# Patient Record
Sex: Female | Born: 1970 | Race: White | Hispanic: No | Marital: Married | Smoking: Never smoker
Health system: Southern US, Community
[De-identification: ages and names within clinical notes are randomized; demographics above are authoritative.]

## PROBLEM LIST (undated history)

## (undated) DIAGNOSIS — J45909 Unspecified asthma, uncomplicated: Secondary | ICD-10-CM

## (undated) HISTORY — PX: URETER SURGERY: SHX823

## (undated) HISTORY — DX: Unspecified asthma, uncomplicated: J45.909

---

## 2017-07-13 ENCOUNTER — Other Ambulatory Visit: Payer: Self-pay | Admitting: Sports Medicine

## 2017-07-13 ENCOUNTER — Ambulatory Visit: Payer: Managed Care, Other (non HMO) | Admitting: Sports Medicine

## 2017-07-13 DIAGNOSIS — M2550 Pain in unspecified joint: Secondary | ICD-10-CM | POA: Diagnosis not present

## 2017-07-13 DIAGNOSIS — M7741 Metatarsalgia, right foot: Secondary | ICD-10-CM | POA: Diagnosis not present

## 2017-07-13 MED ORDER — MELOXICAM 15 MG PO TABS: ORAL_TABLET | ORAL | 3 refills | Status: DC

## 2017-07-13 NOTE — Assessment & Plan Note (Signed)
Metatarsal pad placed in shoe, return for custom molded orthotics with metatarsal pads.

## 2017-07-13 NOTE — Progress Notes (Signed)
   Subjective:    I'm seeing this patient as a consultation for:  Dr. Ferd GlassingAnn Potter  CC: Foot pain, arthralgias  HPI: Foot pain: Localized under the right foot at the head of the second through fourth metatarsals.  Worse with weightbearing.  No overt numbness and tingling into the toes.  She saw a chiropractor who told her it was her bunion.  Polyarthralgia: In the metacarpophalangeal joints and the proximal interphalangeal joints, stiffness in the morning, occasional pain with lateral loading of the joints.  No mechanical symptoms or clicking, no trauma.  No family history of rheumatoid arthritis.  Past medical history, Surgical history, Family history not pertinant except as noted below, Social history, Allergies, and medications have been entered into the medical record, reviewed, and no changes needed.   Review of Systems: No headache, visual changes, nausea, vomiting, diarrhea, constipation, dizziness, abdominal pain, skin rash, fevers, chills, night sweats, weight loss, swollen lymph nodes, body aches, joint swelling, muscle aches, chest pain, shortness of breath, mood changes, visual or auditory hallucinations.   Objective:   General: Well Developed, well nourished, and in no acute distress.  Neuro:  Extra-ocular muscles intact, able to move all 4 extremities, sensation grossly intact.  Deep tendon reflexes tested were normal. Psych: Alert and oriented, mood congruent with affect. ENT:  Ears and nose appear unremarkable.  Hearing grossly normal. Neck: Unremarkable overall appearance, trachea midline.  No visible thyroid enlargement. Eyes: Conjunctivae and lids appear unremarkable.  Pupils equal and round. Skin: Warm and dry, no rashes noted.  Cardiovascular: Pulses palpable, no extremity edema. Right foot: No visible erythema or swelling. Range of motion is full in all directions. Strength is 5/5 in all directions. Only minimal hallux valgus. No pes cavus or pes planus. No abnormal  callus noted. No pain over the navicular prominence, or base of fifth metatarsal. No tenderness to palpation of the calcaneal insertion of plantar fascia. No pain at the Achilles insertion. No pain over the calcaneal bursa. No pain of the retrocalcaneal bursa. No tenderness to palpation over the tarsals, metatarsals, or phalanges. No hallux rigidus or limitus. No tenderness palpation over interphalangeal joints. No pain with compression of the metatarsal heads. Neurovascularly intact distally. Foot inspection and palpation reveals breakdown of the transverse arch and a drop of MT heads. Abnormal callus is present between the second, third, and fourth metatarsal heads. Hammer toes are present. TTP under the metatarsal heads. Hands: Tenderness at the third proximal interphalangeal joints as well as the second and third metacarpophalangeal joints, no palpable synovitis.  No swelling.  Impression and Recommendations:   This case required medical decision making of moderate complexity.  Metatarsalgia, right foot Metatarsal pad placed in shoe, return for custom molded orthotics with metatarsal pads.  Polyarthralgia Mostly in the metacarpophalangeal joints and the proximal interphalangeal joints. Bilateral hand x-rays, rheumatoid testing. Adding meloxicam, we did discuss the pathophysiology and treatment protocol for osteoarthritis.  ___________________________________________ Ihor Austinhomas J. Benjamin Stainhekkekandam, M.D., ABFM., CAQSM. Primary Care and Sports Medicine [facility]  Adjunct Instructor of Family Medicine  [employer]

## 2017-07-13 NOTE — Assessment & Plan Note (Signed)
Mostly in the metacarpophalangeal joints and the proximal interphalangeal joints. Bilateral hand x-rays, rheumatoid testing. Adding meloxicam, we did discuss the pathophysiology and treatment protocol for osteoarthritis.

## 2017-07-17 ENCOUNTER — Ambulatory Visit: Payer: Managed Care, Other (non HMO) | Admitting: Sports Medicine

## 2017-07-17 DIAGNOSIS — M7741 Metatarsalgia, right foot: Secondary | ICD-10-CM

## 2017-07-17 NOTE — Progress Notes (Signed)
    Patient was fitted for a : standard, cushioned, semi-rigid orthotic. The orthotic was heated and afterward the patient stood on the orthotic blank positioned on the orthotic stand. The patient was positioned in subtalar neutral position and 10 degrees of ankle dorsiflexion in a weight bearing stance. After completion of molding, a stable base was applied to the orthotic blank. The blank was ground to a stable position for weight bearing. Size: 7 Base: [facility] Additional Posting and Padding: Medium metatarsal pad right The patient ambulated these, and they were very comfortable.  I spent 40 minutes with this patient, greater than 50% was face-to-face time counseling regarding the below diagnosis.  ___________________________________________ Ihor Austinhomas J. Benjamin Stainhekkekandam, M.D., ABFM., CAQSM. Primary Care and Sports Medicine [facility]  Adjunct Instructor of Family Medicine  [employer]

## 2017-07-17 NOTE — Assessment & Plan Note (Signed)
Custom orthotics with metatarsal pad, left plantar fasciitis as well. Rehab exercises given. Return in 1 month.

## 2017-07-18 LAB — COMPREHENSIVE METABOLIC PANEL WITH GFR
ALT: 10 U/L (ref 6–29)
AST: 17 U/L (ref 10–35)
Alkaline phosphatase (APISO): 54 U/L (ref 33–115)
BUN: 14 mg/dL (ref 7–25)
Calcium: 10 mg/dL (ref 8.6–10.2)
Creat: 1.09 mg/dL (ref 0.50–1.10)
Potassium: 4.2 mmol/L (ref 3.5–5.3)
Total Bilirubin: 0.6 mg/dL (ref 0.2–1.2)

## 2017-07-18 LAB — CBC WITH DIFFERENTIAL/PLATELET
Basophils Absolute: 31 cells/uL (ref 0–200)
Basophils Relative: 0.6 %
Eosinophils Absolute: 140 cells/uL (ref 15–500)
Eosinophils Relative: 2.7 %
HCT: 40.3 % (ref 35.0–45.0)
Hemoglobin: 13.5 g/dL (ref 11.7–15.5)
Lymphs Abs: 1451 cells/uL (ref 850–3900)
MCH: 29.5 pg (ref 27.0–33.0)
MCHC: 33.5 g/dL (ref 32.0–36.0)
MCV: 88.2 fL (ref 80.0–100.0)
MPV: 11 fL (ref 7.5–12.5)
Monocytes Relative: 6.3 %
Neutro Abs: 3250 cells/uL (ref 1500–7800)
Neutrophils Relative %: 62.5 %
Platelets: 272 Thousand/uL (ref 140–400)
RBC: 4.57 Million/uL (ref 3.80–5.10)
RDW: 12.5 % (ref 11.0–15.0)
Total Lymphocyte: 27.9 %
WBC mixed population: 328 {cells}/uL (ref 200–950)
WBC: 5.2 Thousand/uL (ref 3.8–10.8)

## 2017-07-18 LAB — COMPREHENSIVE METABOLIC PANEL
AG Ratio: 1.6 (calc) (ref 1.0–2.5)
Albumin: 4.3 g/dL (ref 3.6–5.1)
CO2: 27 mmol/L (ref 20–32)
Chloride: 104 mmol/L (ref 98–110)
Globulin: 2.7 g/dL (calc) (ref 1.9–3.7)
Glucose, Bld: 79 mg/dL (ref 65–139)
Sodium: 139 mmol/L (ref 135–146)
Total Protein: 7 g/dL (ref 6.1–8.1)

## 2017-07-18 LAB — RHEUMATOID ARTHRITIS DIAGNOSTIC PANEL, COMPREHENSIVE
Cyclic Citrullin Peptide Ab: <16 U (ref <20)
Rheumatoid Factor (IgA): <5 U (ref <=6)
Rheumatoid Factor (IgG): 9 U — ABNORMAL HIGH (ref <=6)
Rheumatoid Factor (IgM): 16 U — ABNORMAL HIGH (ref <=6)
SSA (Ro) (ENA) Antibody, IgG: <1 AI (ref <1.0)
SSB (La) (ENA) Antibody, IgG: <1 AI (ref <1.0)

## 2017-07-18 LAB — CK: Total CK: 89 U/L (ref 29–143)

## 2017-07-18 LAB — URIC ACID: Uric Acid, Serum: 3.7 mg/dL (ref 2.5–7.0)

## 2017-07-18 LAB — SEDIMENTATION RATE: Sed Rate: 6 mm/h (ref 0–20)

## 2017-08-14 ENCOUNTER — Encounter: Payer: Self-pay | Admitting: Sports Medicine

## 2017-08-14 ENCOUNTER — Ambulatory Visit: Payer: Managed Care, Other (non HMO) | Admitting: Sports Medicine

## 2017-08-14 DIAGNOSIS — M7741 Metatarsalgia, right foot: Secondary | ICD-10-CM | POA: Diagnosis not present

## 2017-08-14 DIAGNOSIS — M2550 Pain in unspecified joint: Secondary | ICD-10-CM | POA: Diagnosis not present

## 2017-08-14 NOTE — Assessment & Plan Note (Signed)
Rheumatoid testing, meloxicam has helped her hand pain significantly. We did discuss some non-proven supplements that may be of help. I also gave her some Pennsaid samples.

## 2017-08-14 NOTE — Progress Notes (Signed)
  Subjective:    CC: Foot pain  HPI: Plantar fasciitis: Significantly better with custom orthotics, not diligent with the stretches and continues to walk around barefoot at the house.  Metatarsalgia: Right-sided, improved with custom orthotics with a metatarsal pad  Polyarthralgia: Improved with meloxicam, rheumatoid workup was negative.  Past medical history:  Negative.  See flowsheet/record as well for more information.  Surgical history: Negative.  See flowsheet/record as well for more information.  Family history: Negative.  See flowsheet/record as well for more information.  Social history: Negative.  See flowsheet/record as well for more information.  Allergies, and medications have been entered into the medical record, reviewed, and no changes needed.   (To billers/coders, pertinent past medical, social, surgical, family history can be found in problem list, if problem list is marked as reviewed then this indicates that past medical, social, surgical, family history was also reviewed)  Review of Systems: No fevers, chills, night sweats, weight loss, chest pain, or shortness of breath.   Objective:    General: Well Developed, well nourished, and in no acute distress.  Neuro: Alert and oriented x3, extra-ocular muscles intact, sensation grossly intact.  HEENT: Normocephalic, atraumatic, pupils equal round reactive to light, neck supple, no masses, no lymphadenopathy, thyroid nonpalpable.  Skin: Warm and dry, no rashes. Cardiac: Regular rate and rhythm, no murmurs rubs or gallops, no lower extremity edema.  Respiratory: Clear to auscultation bilaterally. Not using accessory muscles, speaking in full sentences.  Impression and Recommendations:    Polyarthralgia Rheumatoid testing, meloxicam has helped her hand pain significantly. We did discuss some non-proven supplements that may be of help. I also gave her some Pennsaid samples.  Metatarsalgia, right foot Plantar fasciitis  and metatarsalgia are significantly better. We discussed avoiding barefoot walking, and she really has been that diligent with her rehab exercises. She does have hallux valgus on the right side, she will wear her bunion separator, if this does not work I can do a first MTP injection. Return as needed.  I spent 25 minutes with this patient, greater than 50% was face-to-face time counseling regarding the above diagnoses ___________________________________________ Ihor Austinhomas J. Benjamin Stainhekkekandam, M.D., ABFM., CAQSM. Primary Care and Sports Medicine [facility]  Adjunct Instructor of Family Medicine  [employer]

## 2017-08-14 NOTE — Assessment & Plan Note (Signed)
Plantar fasciitis and metatarsalgia are significantly better. We discussed avoiding barefoot walking, and she really has been that diligent with her rehab exercises. She does have hallux valgus on the right side, she will wear her bunion separator, if this does not work I can do a first MTP injection. Return as needed.

## 2018-07-20 ENCOUNTER — Ambulatory Visit (INDEPENDENT_AMBULATORY_CARE_PROVIDER_SITE_OTHER): Payer: Managed Care, Other (non HMO)

## 2018-07-20 ENCOUNTER — Encounter: Payer: Self-pay | Admitting: Sports Medicine

## 2018-07-20 ENCOUNTER — Ambulatory Visit: Payer: Managed Care, Other (non HMO) | Admitting: Sports Medicine

## 2018-07-20 DIAGNOSIS — M25551 Pain in right hip: Secondary | ICD-10-CM

## 2018-07-20 DIAGNOSIS — G8929 Other chronic pain: Secondary | ICD-10-CM

## 2018-07-20 DIAGNOSIS — S73191A Other sprain of right hip, initial encounter: Secondary | ICD-10-CM | POA: Insufficient documentation

## 2018-07-20 MED ORDER — MELOXICAM 15 MG PO TABS
ORAL_TABLET | ORAL | 3 refills | Status: DC
Start: 2018-07-20 — End: 2022-04-29

## 2018-07-20 NOTE — Progress Notes (Signed)
Subjective:    I'm seeing this patient as a consultation for: Dr. Ardith DarkVictor Korang  CC: Right hip pain  HPI: This is a pleasant 47 year old female, for 1 year she is had pain that she localizes in her right hip, radiating around to the groin, worse with prolonged weightbearing, better with sitting, no gelling.  No mechanical symptoms or trauma.  Worse with resisted hip flexion.  She has seen a chiropractor and had some manipulations without any improvement.  Has not tried any NSAIDs and has not had any imaging.  I reviewed the past medical history, family history, social history, surgical history, and allergies today and no changes were needed.  Please see the problem list section below in epic for further details.  Past Medical History: Past Medical History:  Diagnosis Date  . Asthma    Past Surgical History: Past Surgical History:  Procedure Laterality Date  . URETER SURGERY     Social History: Social History   Socioeconomic History  . Marital status: Married    Spouse name: Not on file  . Number of children: Not on file  . Years of education: Not on file  . Highest education level: Not on file  Occupational History  . Not on file  Social Needs  . Financial resource strain: Not on file  . Food insecurity:    Worry: Not on file    Inability: Not on file  . Transportation needs:    Medical: Not on file    Non-medical: Not on file  Tobacco Use  . Smoking status: Never Smoker  . Smokeless tobacco: Never Used  Substance and Sexual Activity  . Alcohol use: No    Frequency: Never  . Drug use: No  . Sexual activity: Not on file  Lifestyle  . Physical activity:    Days per week: Not on file    Minutes per session: Not on file  . Stress: Not on file  Relationships  . Social connections:    Talks on phone: Not on file    Gets together: Not on file    Attends religious service: Not on file    Active member of club or organization: Not on file    Attends meetings of  clubs or organizations: Not on file    Relationship status: Not on file  Other Topics Concern  . Not on file  Social History Narrative  . Not on file   Family History: No family history on file. Allergies: Allergies  Allergen Reactions  . Amoxicillin-Pot Clavulanate Rash   Medications: See med rec.  Review of Systems: No headache, visual changes, nausea, vomiting, diarrhea, constipation, dizziness, abdominal pain, skin rash, fevers, chills, night sweats, weight loss, swollen lymph nodes, body aches, joint swelling, muscle aches, chest pain, shortness of breath, mood changes, visual or auditory hallucinations.   Objective:   General: Well Developed, well nourished, and in no acute distress.  Neuro:  Extra-ocular muscles intact, able to move all 4 extremities, sensation grossly intact.  Deep tendon reflexes tested were normal. Psych: Alert and oriented, mood congruent with affect. ENT:  Ears and nose appear unremarkable.  Hearing grossly normal. Neck: Unremarkable overall appearance, trachea midline.  No visible thyroid enlargement. Eyes: Conjunctivae and lids appear unremarkable.  Pupils equal and round. Skin: Warm and dry, no rashes noted.  Cardiovascular: Pulses palpable, no extremity edema. Right hip: ROM IR: 60 Deg, ER: 60 Deg, Flexion: 120 Deg, Extension: 100 Deg, Abduction: 45 Deg, Adduction: 45 Deg Reproduction of  pain, severe with internal rotation, reproduction of pain with resisted hip flexion. Positive FADIR test. Strength IR: 5/5, ER: 5/5, Flexion: 5/5, Extension: 5/5, Abduction: 5/5, Adduction: 5/5 Pelvic alignment unremarkable to inspection and palpation. Standing hip rotation and gait without trendelenburg / unsteadiness. Greater trochanter without tenderness to palpation. No tenderness over piriformis. No SI joint tenderness and normal minimal SI movement.  Impression and Recommendations:   This case required medical decision making of moderate  complexity.  Right hip pain I have a differential here, hip flexor injury, labral tear, osteoarthritis. Adding meloxicam, hip x-rays, hip flexor rehabilitation exercises.   Return to see me in 2 to 4 weeks, if persistent discomfort we will proceed with an MR arthrogram, pain has been present for a year now.  ___________________________________________ Ihor Austin. Benjamin Stain, M.D., ABFM., CAQSM. Primary Care and Sports Medicine [facility]  Adjunct Professor of Family Medicine  [employer]

## 2018-07-20 NOTE — Assessment & Plan Note (Signed)
I have a differential here, hip flexor injury, labral tear, osteoarthritis. Adding meloxicam, hip x-rays, hip flexor rehabilitation exercises.   Return to see me in 2 to 4 weeks, if persistent discomfort we will proceed with an MR arthrogram, pain has been present for a year now.

## 2018-08-19 ENCOUNTER — Ambulatory Visit (INDEPENDENT_AMBULATORY_CARE_PROVIDER_SITE_OTHER): Payer: Managed Care, Other (non HMO) | Admitting: Sports Medicine

## 2018-08-19 ENCOUNTER — Ambulatory Visit (INDEPENDENT_AMBULATORY_CARE_PROVIDER_SITE_OTHER): Payer: Managed Care, Other (non HMO)

## 2018-08-19 ENCOUNTER — Encounter: Payer: Self-pay | Admitting: Sports Medicine

## 2018-08-19 DIAGNOSIS — M25511 Pain in right shoulder: Secondary | ICD-10-CM

## 2018-08-19 DIAGNOSIS — M25551 Pain in right hip: Secondary | ICD-10-CM | POA: Diagnosis not present

## 2018-08-19 NOTE — Assessment & Plan Note (Signed)
Persistent anterior pain, x-rays were for the most part negative. Concern continues to be for hip labral tear, hip flexor injury. No better after conservative treatment. Overall pain has been for 1 year. Proceeding with MR arthrogram of the right hip.

## 2018-08-19 NOTE — Assessment & Plan Note (Signed)
Exam is concerning for labral injury. Rotator cuff rehab exercises, x-rays done today. Return to see me in 4 to 6 weeks, MR arthrogram if no better.

## 2018-08-19 NOTE — Progress Notes (Signed)
Subjective:    I'm seeing this patient as a consultation for:  Dr. Ardith Dark  CC: Right shoulder pain  HPI: This is a pleasant 47 year old female, for the past few weeks she is noted increasing pain over her deltoid, worse with overhead activities, abduction of the shoulder.  No overt mechanical symptoms, trauma.  Hip pain: Not much better with conservative treatment, she does get mechanical symptoms, her exam was suspicious for a hip labral tear at the last visit.  I reviewed the past medical history, family history, social history, surgical history, and allergies today and no changes were needed.  Please see the problem list section below in epic for further details.  Past Medical History: Past Medical History:  Diagnosis Date  . Asthma    Past Surgical History: Past Surgical History:  Procedure Laterality Date  . URETER SURGERY     Social History: Social History   Socioeconomic History  . Marital status: Married    Spouse name: Not on file  . Number of children: Not on file  . Years of education: Not on file  . Highest education level: Not on file  Occupational History  . Not on file  Social Needs  . Financial resource strain: Not on file  . Food insecurity:    Worry: Not on file    Inability: Not on file  . Transportation needs:    Medical: Not on file    Non-medical: Not on file  Tobacco Use  . Smoking status: Never Smoker  . Smokeless tobacco: Never Used  Substance and Sexual Activity  . Alcohol use: No    Frequency: Never  . Drug use: No  . Sexual activity: Not on file  Lifestyle  . Physical activity:    Days per week: Not on file    Minutes per session: Not on file  . Stress: Not on file  Relationships  . Social connections:    Talks on phone: Not on file    Gets together: Not on file    Attends religious service: Not on file    Active member of club or organization: Not on file    Attends meetings of clubs or organizations: Not on file   Relationship status: Not on file  Other Topics Concern  . Not on file  Social History Narrative  . Not on file   Family History: No family history on file. Allergies: Allergies  Allergen Reactions  . Amoxicillin-Pot Clavulanate Rash   Medications: See med rec.  Review of Systems: No headache, visual changes, nausea, vomiting, diarrhea, constipation, dizziness, abdominal pain, skin rash, fevers, chills, night sweats, weight loss, swollen lymph nodes, body aches, joint swelling, muscle aches, chest pain, shortness of breath, mood changes, visual or auditory hallucinations.   Objective:   General: Well Developed, well nourished, and in no acute distress.  Neuro:  Extra-ocular muscles intact, able to move all 4 extremities, sensation grossly intact.  Deep tendon reflexes tested were normal. Psych: Alert and oriented, mood congruent with affect. ENT:  Ears and nose appear unremarkable.  Hearing grossly normal. Neck: Unremarkable overall appearance, trachea midline.  No visible thyroid enlargement. Eyes: Conjunctivae and lids appear unremarkable.  Pupils equal and round. Skin: Warm and dry, no rashes noted.  Cardiovascular: Pulses palpable, no extremity edema. Right shoulder: Inspection reveals no abnormalities, atrophy or asymmetry. Palpation is normal with no tenderness over AC joint or bicipital groove. ROM is full in all planes. Rotator cuff strength normal throughout. No signs of  impingement with negative Neer and Hawkin's tests, empty can. Speeds and Yergason's tests normal. Positive Obrien's, negative crank, negative clunk, and good stability. Normal scapular function observed. No painful arc and no drop arm sign. No apprehension sign  Impression and Recommendations:   This case required medical decision making of moderate complexity.  Right shoulder pain Exam is concerning for labral injury. Rotator cuff rehab exercises, x-rays done today. Return to see me in 4 to 6  weeks, MR arthrogram if no better.  Right hip pain Persistent anterior pain, x-rays were for the most part negative. Concern continues to be for hip labral tear, hip flexor injury. No better after conservative treatment. Overall pain has been for 1 year. Proceeding with MR arthrogram of the right hip. ___________________________________________ Ihor Austinhomas J. Benjamin Stainhekkekandam, M.D., ABFM., CAQSM. Primary Care and Sports Medicine [facility]  Adjunct Professor of Family Medicine  [employer]

## 2018-09-06 ENCOUNTER — Other Ambulatory Visit: Payer: Managed Care, Other (non HMO)

## 2018-09-06 ENCOUNTER — Ambulatory Visit: Payer: Managed Care, Other (non HMO) | Admitting: Sports Medicine

## 2018-09-20 ENCOUNTER — Ambulatory Visit: Payer: Managed Care, Other (non HMO) | Admitting: Sports Medicine

## 2018-09-20 ENCOUNTER — Other Ambulatory Visit: Payer: Managed Care, Other (non HMO)

## 2018-09-28 ENCOUNTER — Ambulatory Visit: Payer: Managed Care, Other (non HMO) | Admitting: Sports Medicine

## 2018-10-22 ENCOUNTER — Telehealth: Payer: Self-pay | Admitting: Sports Medicine

## 2018-10-22 DIAGNOSIS — M25551 Pain in right hip: Secondary | ICD-10-CM

## 2018-10-22 NOTE — Telephone Encounter (Signed)
I have already ordered it for the hip.  Let me know when she confirms whether she is referring to the hip or the shoulder.  Remind her also that we can do it here.

## 2018-10-22 NOTE — Telephone Encounter (Signed)
Pt called and left message needing orders for an arthrogram MRI, called and left message for pt to verify which are shoulder or hip, asked patient to call back and verify. When we get order we need to fax to [facility] at [phone]

## 2018-10-25 ENCOUNTER — Encounter: Payer: Self-pay | Admitting: *Deleted

## 2018-10-25 NOTE — Telephone Encounter (Signed)
This encounter was created in error - please disregard.

## 2018-10-25 NOTE — Telephone Encounter (Signed)
Ok order placed.  

## 2018-10-25 NOTE — Telephone Encounter (Signed)
Pt called back to let us know that the MRI is of her right hip.  I see the order in her chart but she needs it sent to [facility] and authorized for them.

## 2018-10-25 NOTE — Addendum Note (Signed)
Addended by: Monica Becton on: 10/25/2018 05:39 PM   Modules accepted: Orders

## 2018-10-27 NOTE — Telephone Encounter (Signed)
Received call from [facility], [phone]) advising they cannot do the MR Arthrogram.   Called and spoke with Pt, she will contact insurance company and see where else she can go. She will provide me with update.

## 2018-11-15 ENCOUNTER — Ambulatory Visit: Payer: Managed Care, Other (non HMO) | Admitting: Sports Medicine

## 2018-11-15 ENCOUNTER — Ambulatory Visit (INDEPENDENT_AMBULATORY_CARE_PROVIDER_SITE_OTHER): Payer: Managed Care, Other (non HMO)

## 2018-11-15 DIAGNOSIS — S73191S Other sprain of right hip, sequela: Secondary | ICD-10-CM

## 2018-11-15 DIAGNOSIS — S73191A Other sprain of right hip, initial encounter: Secondary | ICD-10-CM | POA: Diagnosis not present

## 2018-11-15 DIAGNOSIS — X58XXXA Exposure to other specified factors, initial encounter: Secondary | ICD-10-CM

## 2018-11-15 DIAGNOSIS — M25551 Pain in right hip: Secondary | ICD-10-CM

## 2018-11-15 MED ORDER — GADOBUTROL 1 MMOL/ML IV SOLN
1.0000 mL | Freq: Once | INTRAVENOUS | Status: AC | PRN
  Administered 2018-11-15: 1 mL via INTRAVENOUS

## 2018-11-15 NOTE — Progress Notes (Signed)
  Procedure: Real-time Ultrasound Guided gadolinium contrast injection of right femoral acetabular joint Device: GE Logiq E  Verbal informed consent obtained.  Time-out conducted.  Noted no overlying erythema, induration, or other signs of local infection.  Skin prepped in a sterile fashion.  Local anesthesia: Topical Ethyl chloride.  With sterile technique and under real time ultrasound guidance: 22-gauge spinal needle advanced to the femoral head/neck junction, contacted bone and injected 1 cc Kenalog 40, 2 cc lidocaine, 2 cc bupivacaine, syringe switched and I injected 0.1 cc gadolinium, syringe again switched and joint distended with 12 cc of sterile saline. Joint visualized and capsule seen distending confirming intra-articular placement of contrast material and medication. Completed without difficulty  Advised to call if fevers/chills, erythema, induration, drainage, or persistent bleeding.  Images permanently stored and available for review in the ultrasound unit.  Impression: Technically successful ultrasound guided gadolinium contrast injection for MR arthrography.  Please see separate MR arthrogram report.

## 2018-11-15 NOTE — Assessment & Plan Note (Addendum)
Arthrogram injection for hip MRI. Further management per results.  Degenerative tearing of the anterior, and superior hip labrum, this is expected, lets see how things go over the next month because sometimes the steroid injectioned can calm things down.

## 2019-12-07 IMAGING — MR MRI OF THE RIGHT HIP WITH CONTRAST
5 series · 40 of 40 positions shown · IV contrast (agent unspecified)
Comparison: Plain films right hip 07/20/2018.

CLINICAL DATA: Anterior and lateral right hip pain for 1.5 years.
No known injury.

EXAM:
MRI OF THE RIGHT HIP WITH CONTRAST (MR Arthrogram)
TECHNIQUE: Multiplanar, multisequence MR imaging of the hip was performed
immediately following contrast injection into the hip joint under
fluoroscopic guidance. No intravenous contrast was administered.

[Series 3: T1 fat-sat · axial · 4.0mm · 0.47mm/px · z∈[-130,-22]mm · 7 of 25 slices shown (1 of 3)]
[im 1/25]
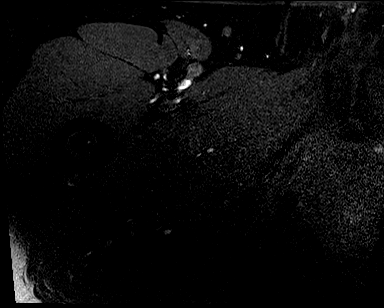
[im 5/25]
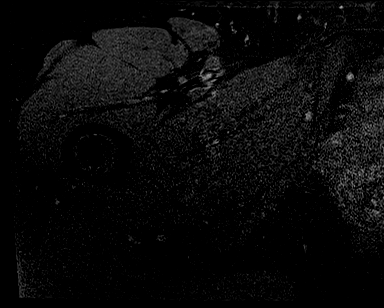
[im 9/25]
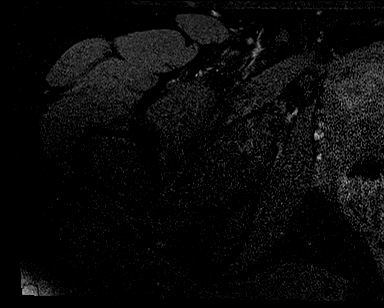
[im 13/25]
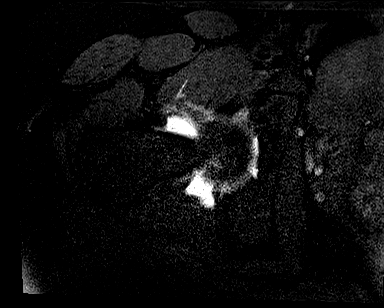
[im 17/25]
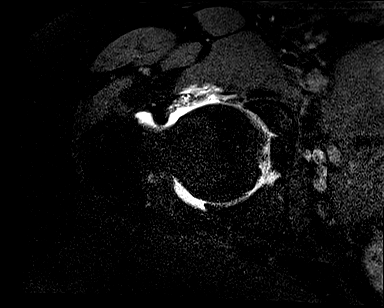
[im 21/25]
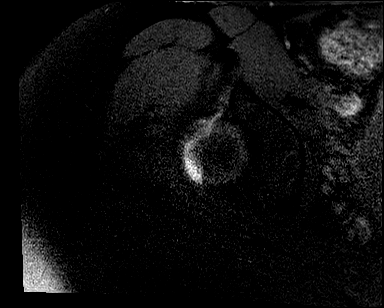
[im 25/25]
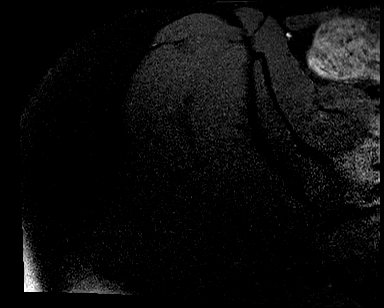

[Series 4: T1 fat-sat · coronal · 4.0mm · 0.56mm/px · 7 of 25 slices shown (2 of 3)]
[im 1/25]
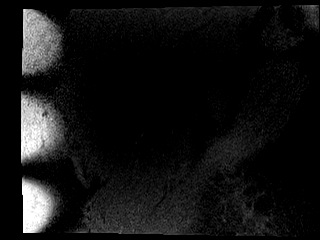
[im 5/25]
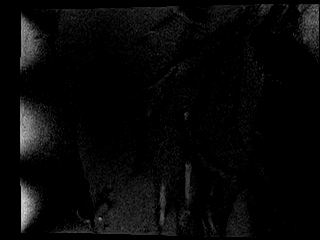
[im 9/25]
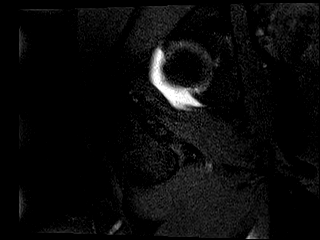
[im 13/25]
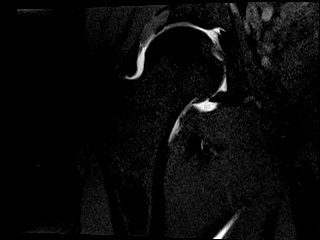
[im 17/25]
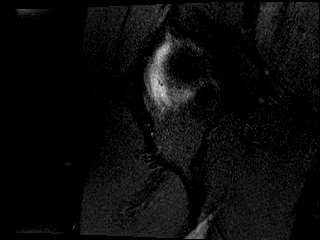
[im 21/25]
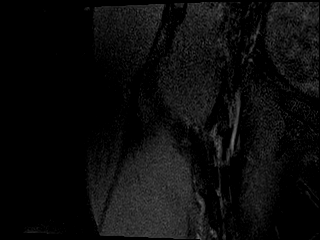
[im 25/25]
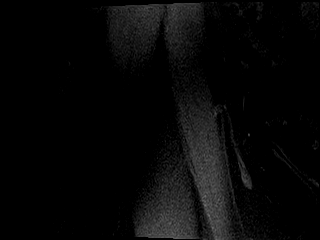

[Series 5: T1 fat-sat · sagittal · 4.0mm · 0.56mm/px · 8 of 28 slices shown (3 of 3)]
[im 1/28]
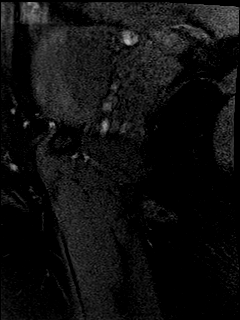
[im 4/28]
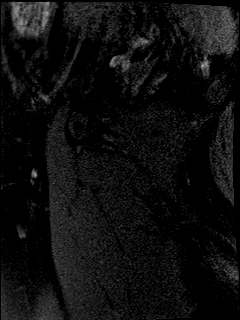
[im 8/28]
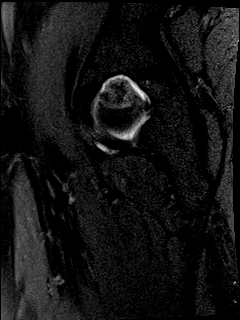
[im 12/28]
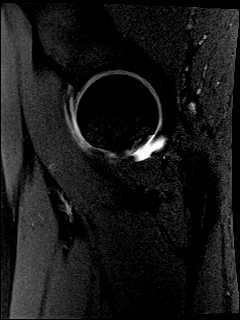
[im 16/28]
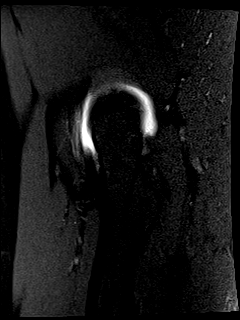
[im 20/28]
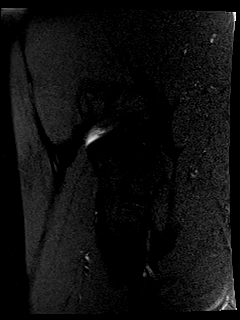
[im 24/28]
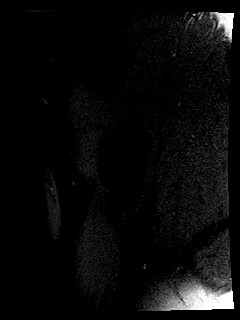
[im 28/28]
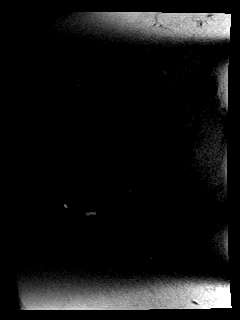

[Series 6: T1 · coronal · 4.0mm · 0.85mm/px · 9 of 35 slices shown]
[im 1/35]
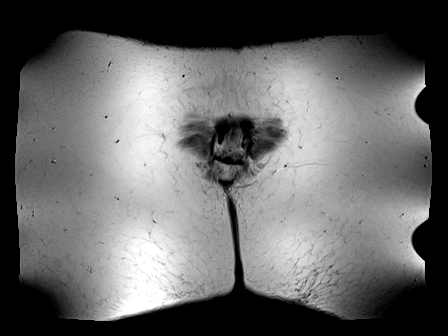
[im 5/35]
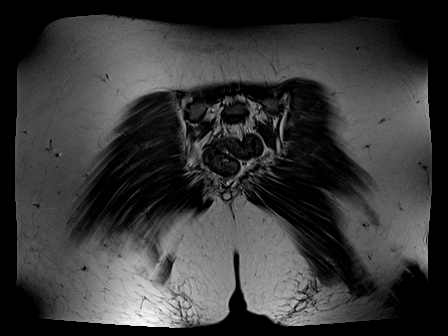
[im 9/35]
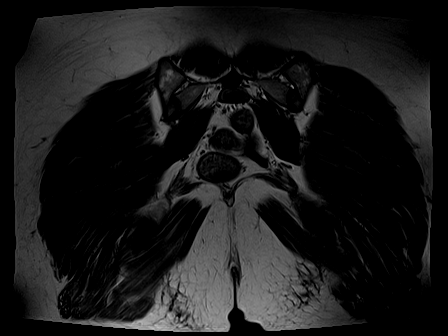
[im 13/35]
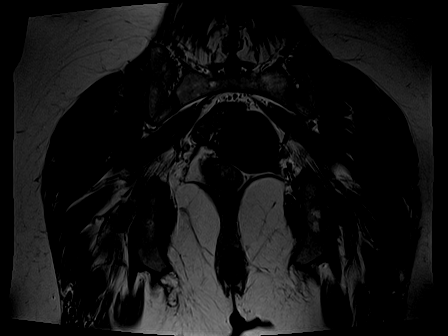
[im 18/35]
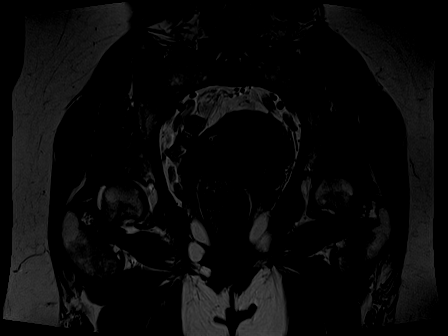
[im 22/35]
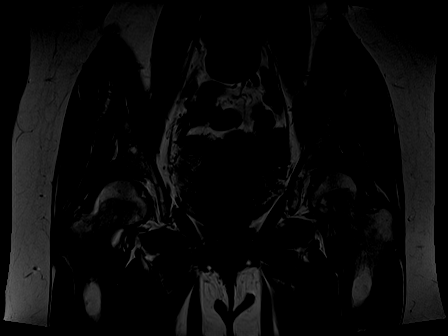
[im 26/35]
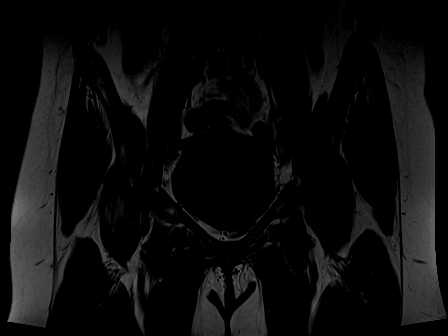
[im 30/35]
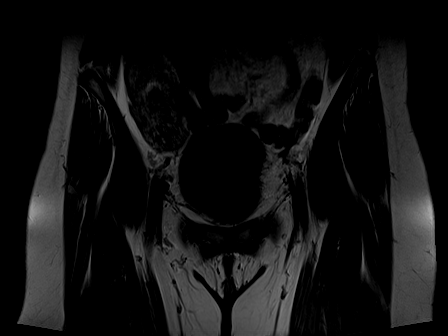
[im 35/35]
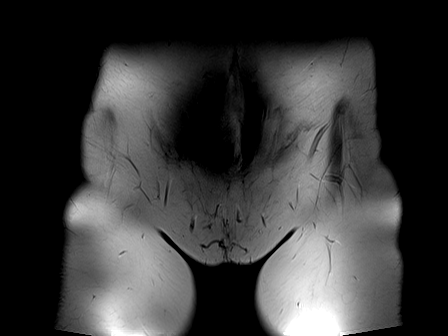

[Series 7: T2 fat-sat · coronal · 4.0mm · 0.99mm/px · 9 of 35 slices shown]
[im 1/35]
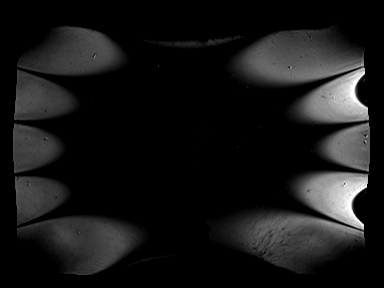
[im 5/35]
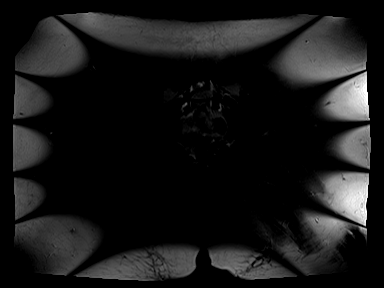
[im 9/35]
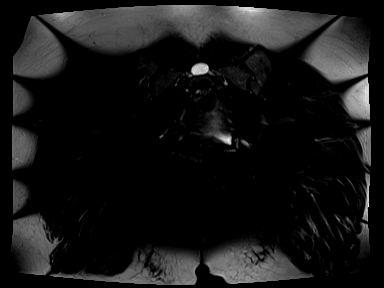
[im 13/35]
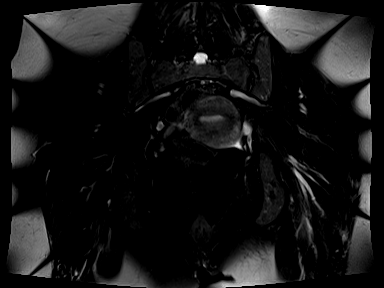
[im 18/35]
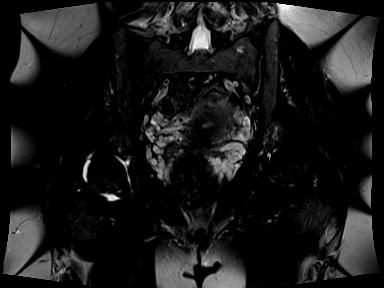
[im 22/35]
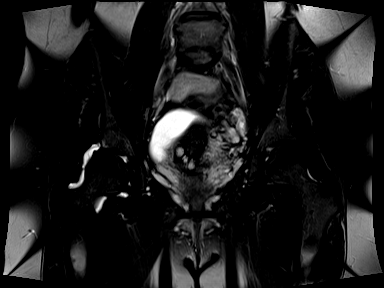
[im 26/35]
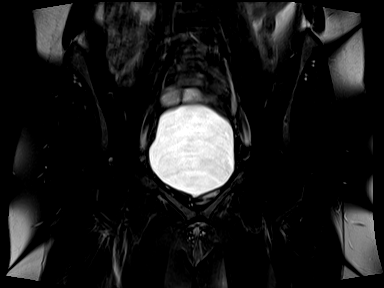
[im 30/35]
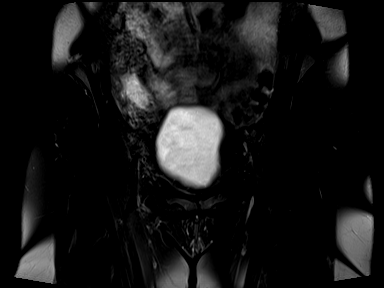
[im 35/35]
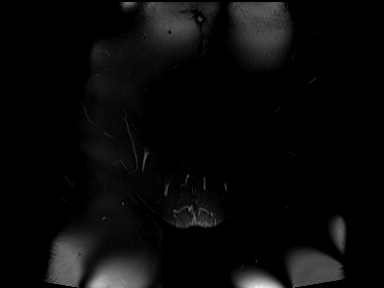

[40 of 40 positions shown; findings below may reference images not displayed]

FINDINGS: Bones: Marrow signal is normal throughout without fracture, stress
change or focal lesion. No avascular necrosis of the femoral heads.
No subchondral cyst formation or edema about the hips.

Articular cartilage and labrum

Articular cartilage:  Minimally degenerated.

Labrum: Degenerative tearing of the anterior, superior right labrum
is identified.

Joint or bursal effusion

Joint effusion: The right hip is distended with contrast. No left
effusion.

Bursae: Normal.

Muscles and tendons

Muscles and tendons:  Intact and normal in appearance.

Other findings

Miscellaneous:   Imaged intrapelvic contents are unremarkable.
IMPRESSION: Degenerative tearing of the anterior, superior right labrum. Mild
hyaline cartilage loss about the right hip is also identified. The
study is otherwise negative.

## 2021-10-25 ENCOUNTER — Ambulatory Visit: Payer: Managed Care, Other (non HMO) | Admitting: Sports Medicine

## 2022-04-29 ENCOUNTER — Ambulatory Visit (INDEPENDENT_AMBULATORY_CARE_PROVIDER_SITE_OTHER): Payer: Managed Care, Other (non HMO)

## 2022-04-29 ENCOUNTER — Ambulatory Visit: Payer: Managed Care, Other (non HMO) | Admitting: Sports Medicine

## 2022-04-29 DIAGNOSIS — M25551 Pain in right hip: Secondary | ICD-10-CM

## 2022-04-29 DIAGNOSIS — M722 Plantar fascial fibromatosis: Secondary | ICD-10-CM

## 2022-04-29 MED ORDER — MELOXICAM 15 MG PO TABS: ORAL_TABLET | ORAL | 3 refills | Status: AC

## 2022-04-29 NOTE — Assessment & Plan Note (Addendum)
This is a very pleasant 51 year old female, she has a relatively long history of pain plantar aspect left heel, worse in the morning and worse with prolonged walking. She has done some stretches, she has avoided barefoot walking. She tried some NSAIDs, all without sufficient relief. She does have a trip to [place] next week. Due to the lack of time we will treat her aggressively with an injection today, we will have her do the home conditioning and continue to avoid barefoot walking, wear athletic shoes as much as possible. Refilling meloxicam. Return to see me in about 6 weeks.

## 2022-04-29 NOTE — Progress Notes (Signed)
    Procedures performed today:    Procedure: Real-time Ultrasound Guided injection of the left plantar fascia origin. Device: Samsung HS60  Verbal informed consent obtained.  Time-out conducted.  Noted no overlying erythema, induration, or other signs of local infection.  Skin prepped in a sterile fashion.  Local anesthesia: Topical Ethyl chloride.  With sterile technique and under real time ultrasound guidance: Noted thickened plantar fascia, 1 cc Kenalog 40, 1 cc lidocaine, 1 cc bupivacaine injected easily Completed without difficulty  Advised to call if fevers/chills, erythema, induration, drainage, or persistent bleeding.  Images permanently stored and available for review in PACS.  Impression: Technically successful ultrasound guided injection.  Independent interpretation of notes and tests performed by another provider:   None.  Brief History, Exam, Impression, and Recommendations:    Plantar fasciitis, left This is a very pleasant 51 year old female, she has a relatively long history of pain plantar aspect left heel, worse in the morning and worse with prolonged walking. She has done some stretches, she has avoided barefoot walking. She tried some NSAIDs, all without sufficient relief. She does have a trip to [place] next week. Due to the lack of time we will treat her aggressively with an injection today, we will have her do the home conditioning and continue to avoid barefoot walking, wear athletic shoes as much as possible. Refilling meloxicam. Return to see me in about 6 weeks.    ____________________________________________ Ihor Austin. Benjamin Stain, M.D., ABFM., CAQSM., AME. Primary Care and Sports Medicine [facility]  Adjunct Professor of Family Medicine  [employer]  Restaurant manager, fast food

## 2022-06-10 ENCOUNTER — Ambulatory Visit: Payer: Managed Care, Other (non HMO) | Admitting: Sports Medicine

## 2022-06-10 DIAGNOSIS — M722 Plantar fascial fibromatosis: Secondary | ICD-10-CM

## 2022-06-10 NOTE — Assessment & Plan Note (Signed)
Jody Gomez returns, she is a pleasant 51 year old female, we have been treating her for planter fasciitis, she did really well with some custom orthotics that I made her back in 2019, she then started having increasing pain, tried NSAIDs, stretching all without sufficient relief. She had a trip coming up to [place] so we injected her back at the end of August, and she had near complete relief. She continues to do well, I have advised her to continue to work on the home conditioning focusing more on the foot intrinsics than the stretching. She would like another set of orthotics, we will do the referral to Dr. Raeford Razor. Return to see me as needed.

## 2022-06-10 NOTE — Progress Notes (Signed)
    Procedures performed today:    None.  Independent interpretation of notes and tests performed by another provider:   None.  Brief History, Exam, Impression, and Recommendations:    Plantar fasciitis, left Jody Gomez returns, she is a pleasant 51 year old female, we have been treating her for planter fasciitis, she did really well with some custom orthotics that I made her back in 2019, she then started having increasing pain, tried NSAIDs, stretching all without sufficient relief. She had a trip coming up to [place] so we injected her back at the end of August, and she had near complete relief. She continues to do well, I have advised her to continue to work on the home conditioning focusing more on the foot intrinsics than the stretching. She would like another set of orthotics, we will do the referral to Dr. Raeford Razor. Return to see me as needed.    ____________________________________________ Jody Gomez, M.D., ABFM., CAQSM., AME. Primary Care and Sports Medicine [facility]  Adjunct Professor of [employer] [facility] [employer] [facility]  Risk manager

## 2022-06-27 ENCOUNTER — Encounter: Payer: Self-pay | Admitting: Family Medicine

## 2022-06-27 ENCOUNTER — Ambulatory Visit (INDEPENDENT_AMBULATORY_CARE_PROVIDER_SITE_OTHER): Payer: Managed Care, Other (non HMO) | Admitting: Family Medicine

## 2022-06-27 DIAGNOSIS — M722 Plantar fascial fibromatosis: Secondary | ICD-10-CM

## 2022-06-27 NOTE — Assessment & Plan Note (Signed)
Acute on chronic in nature.  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 -Counseled on home exercise therapy and supportive care. - smartcell technology with scaphoid pad. -Could consider adding metatarsal pad

## 2022-06-27 NOTE — Progress Notes (Signed)
  Jody Gomez - 51 y.o. female MRN [id number]  Date of birth: 07/03/1971  SUBJECTIVE:  Including CC & ROS.  No chief complaint on file.   Jody Gomez is a 51 y.o. female that is  here for acute on chronic plantar fasciitis. Has fairly cavus feet.    Review of Systems See HPI   HISTORY: Past Medical, Surgical, Social, and Family History Reviewed & Updated per EMR.   Pertinent Historical Findings include:  Past Medical History:  Diagnosis Date   Asthma     Past Surgical History:  Procedure Laterality Date   URETER SURGERY       PHYSICAL EXAM:  VS: Ht 5\' 7"  (1.702 m)   Wt 158 lb (71.7 kg)   BMI 24.75 kg/m  Physical Exam Gen: NAD, alert, cooperative with exam, well-appearing MSK:  Neurovascularly intact    Patient was fitted for a smartcell orthotic. The orthotic was heated and afterward the patient stood on the orthotic blank positioned on the orthotic stand. The patient was positioned in subtalar neutral position and 10 degrees of ankle dorsiflexion in a weight bearing stance. After completion of molding, a stable base was applied to the orthotic blank. The blank was ground to a stable position for weight bearing. Size: 9 Pairs: 2 Base: Blue EVA Additional Posting and Padding: scaphoid pads b/l  The patient ambulated these, and they were very comfortable.     ASSESSMENT & PLAN:   Plantar fasciitis, left Acute on chronic in nature.  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 -Counseled on home exercise therapy and supportive care. - smartcell technology with scaphoid pad. -Could consider adding metatarsal pad

## 2022-12-22 ENCOUNTER — Encounter: Payer: Self-pay | Admitting: *Deleted

## 2024-05-10 ENCOUNTER — Encounter: Payer: Self-pay | Admitting: Sports Medicine
